# Patient Record
Sex: Male | Born: 1965 | Race: Black or African American | Hispanic: No | Marital: Single | Smoking: Current every day smoker
Health system: Southern US, Community
[De-identification: ages and names within clinical notes are randomized; demographics above are authoritative.]

## PROBLEM LIST (undated history)

## (undated) DIAGNOSIS — I10 Essential (primary) hypertension: Secondary | ICD-10-CM

## (undated) DIAGNOSIS — E119 Type 2 diabetes mellitus without complications: Secondary | ICD-10-CM

---

## 2013-06-16 ENCOUNTER — Emergency Department: Payer: Self-pay | Admitting: Emergency Medicine

## 2013-06-16 LAB — BASIC METABOLIC PANEL
Anion Gap: 8 (ref 7–16)
BUN: 23 mg/dL — ABNORMAL HIGH (ref 7–18)
CALCIUM: 8.7 mg/dL (ref 8.5–10.1)
Chloride: 92 mmol/L — ABNORMAL LOW (ref 98–107)
Co2: 28 mmol/L (ref 21–32)
Creatinine: 1.51 mg/dL — ABNORMAL HIGH (ref 0.60–1.30)
EGFR (African American): 48 — ABNORMAL LOW
GFR CALC NON AF AMER: 41 — AB
Glucose: 416 mg/dL — ABNORMAL HIGH (ref 65–99)
Osmolality: 278 (ref 275–301)
POTASSIUM: 4.4 mmol/L (ref 3.5–5.1)
Sodium: 128 mmol/L — ABNORMAL LOW (ref 136–145)

## 2013-06-16 LAB — COMPREHENSIVE METABOLIC PANEL
ALBUMIN: 3.9 g/dL (ref 3.4–5.0)
ALT: 77 U/L (ref 12–78)
Alkaline Phosphatase: 80 U/L
Anion Gap: 5 — ABNORMAL LOW (ref 7–16)
BILIRUBIN TOTAL: 0.6 mg/dL (ref 0.2–1.0)
BUN: 22 mg/dL — ABNORMAL HIGH (ref 7–18)
CALCIUM: 9.3 mg/dL (ref 8.5–10.1)
CO2: 27 mmol/L (ref 21–32)
CREATININE: 1.56 mg/dL — AB (ref 0.60–1.30)
Chloride: 89 mmol/L — ABNORMAL LOW (ref 98–107)
EGFR (Non-African Amer.): 40 — ABNORMAL LOW
GFR CALC AF AMER: 46 — AB
Glucose: 486 mg/dL — ABNORMAL HIGH (ref 65–99)
Osmolality: 269 (ref 275–301)
POTASSIUM: 5.6 mmol/L — AB (ref 3.5–5.1)
SGOT(AST): 57 U/L — ABNORMAL HIGH (ref 15–37)
SODIUM: 121 mmol/L — AB (ref 136–145)
TOTAL PROTEIN: 7.9 g/dL (ref 6.4–8.2)

## 2013-06-16 LAB — URINALYSIS, COMPLETE
BACTERIA: NONE SEEN
BILIRUBIN, UR: NEGATIVE
Blood: NEGATIVE
Ketone: NEGATIVE
Leukocyte Esterase: NEGATIVE
NITRITE: NEGATIVE
Ph: 5 (ref 4.5–8.0)
Protein: NEGATIVE
Specific Gravity: 1.025 (ref 1.003–1.030)

## 2013-06-16 LAB — CBC WITH DIFFERENTIAL/PLATELET
BASOS PCT: 1 %
Basophil #: 0.1 10*3/uL (ref 0.0–0.1)
EOS ABS: 0.3 10*3/uL (ref 0.0–0.7)
EOS PCT: 3.4 %
HCT: 40.4 % (ref 40.0–52.0)
HGB: 13.3 g/dL (ref 13.0–18.0)
Lymphocyte #: 2.4 10*3/uL (ref 1.0–3.6)
Lymphocyte %: 32.1 %
MCH: 26 pg (ref 26.0–34.0)
MCHC: 32.9 g/dL (ref 32.0–36.0)
MCV: 79 fL — ABNORMAL LOW (ref 80–100)
MONOS PCT: 5.9 %
Monocyte #: 0.4 x10 3/mm (ref 0.2–1.0)
NEUTROS ABS: 4.3 10*3/uL (ref 1.4–6.5)
Neutrophil %: 57.6 %
PLATELETS: 595 10*3/uL — AB (ref 150–440)
RBC: 5.12 10*6/uL (ref 4.40–5.90)
RDW: 22.9 % — AB (ref 11.5–14.5)
WBC: 7.5 10*3/uL (ref 3.8–10.6)

## 2013-06-16 LAB — CBC
HCT: 43.6 % (ref 40.0–52.0)
HGB: 14.6 g/dL (ref 13.0–18.0)
MCH: 26.4 pg (ref 26.0–34.0)
MCHC: 33.6 g/dL (ref 32.0–36.0)
MCV: 79 fL — ABNORMAL LOW (ref 80–100)
Platelet: 635 10*3/uL — ABNORMAL HIGH (ref 150–440)
RBC: 5.55 10*6/uL (ref 4.40–5.90)
RDW: 22.9 % — AB (ref 11.5–14.5)
WBC: 9.9 10*3/uL (ref 3.8–10.6)

## 2013-06-16 LAB — TROPONIN I: Troponin-I: <0.02 ng/mL

## 2013-06-16 LAB — LIPASE, BLOOD: LIPASE: 157 U/L (ref 73–393)

## 2013-06-17 ENCOUNTER — Emergency Department: Payer: Self-pay | Admitting: Emergency Medicine

## 2013-06-17 LAB — CBC
HCT: 38.8 % — AB (ref 40.0–52.0)
HGB: 12.9 g/dL — AB (ref 13.0–18.0)
MCH: 26.3 pg (ref 26.0–34.0)
MCHC: 33.3 g/dL (ref 32.0–36.0)
MCV: 79 fL — AB (ref 80–100)
Platelet: 573 10*3/uL — ABNORMAL HIGH (ref 150–440)
RBC: 4.92 10*6/uL (ref 4.40–5.90)
RDW: 22.9 % — ABNORMAL HIGH (ref 11.5–14.5)
WBC: 9.8 10*3/uL (ref 3.8–10.6)

## 2013-06-17 LAB — BASIC METABOLIC PANEL
Anion Gap: 8 (ref 7–16)
BUN: 36 mg/dL — ABNORMAL HIGH (ref 7–18)
CREATININE: 2.08 mg/dL — AB (ref 0.60–1.30)
Calcium, Total: 8.4 mg/dL — ABNORMAL LOW (ref 8.5–10.1)
Chloride: 89 mmol/L — ABNORMAL LOW (ref 98–107)
Co2: 28 mmol/L (ref 21–32)
EGFR (Non-African Amer.): 37 — ABNORMAL LOW
GFR CALC AF AMER: 43 — AB
GLUCOSE: 281 mg/dL — AB (ref 65–99)
Osmolality: 270 (ref 275–301)
Potassium: 3.9 mmol/L (ref 3.5–5.1)
Sodium: 125 mmol/L — ABNORMAL LOW (ref 136–145)

## 2014-09-01 ENCOUNTER — Emergency Department
Admission: EM | Admit: 2014-09-01 | Discharge: 2014-09-02 | Disposition: A | Discharge status: Home / Self Care | Payer: BLUE CROSS/BLUE SHIELD | Attending: Emergency Medicine | Admitting: Emergency Medicine

## 2014-09-01 ENCOUNTER — Encounter: Payer: Self-pay | Admitting: Emergency Medicine

## 2014-09-01 DIAGNOSIS — R109 Unspecified abdominal pain: Secondary | ICD-10-CM | POA: Diagnosis present

## 2014-09-01 DIAGNOSIS — I10 Essential (primary) hypertension: Secondary | ICD-10-CM | POA: Insufficient documentation

## 2014-09-01 DIAGNOSIS — E119 Type 2 diabetes mellitus without complications: Secondary | ICD-10-CM | POA: Insufficient documentation

## 2014-09-01 DIAGNOSIS — Z720 Tobacco use: Secondary | ICD-10-CM | POA: Insufficient documentation

## 2014-09-01 DIAGNOSIS — R1010 Upper abdominal pain, unspecified: Secondary | ICD-10-CM

## 2014-09-01 HISTORY — DX: Type 2 diabetes mellitus without complications: E11.9

## 2014-09-01 HISTORY — DX: Essential (primary) hypertension: I10

## 2014-09-01 LAB — COMPREHENSIVE METABOLIC PANEL
ALBUMIN: 3.7 g/dL (ref 3.5–5.0)
ALT: 46 U/L (ref 17–63)
AST: 28 U/L (ref 15–41)
Alkaline Phosphatase: 60 U/L (ref 38–126)
Anion gap: 8 (ref 5–15)
BILIRUBIN TOTAL: 0.5 mg/dL (ref 0.3–1.2)
BUN: 13 mg/dL (ref 6–20)
CHLORIDE: 107 mmol/L (ref 101–111)
CO2: 25 mmol/L (ref 22–32)
CREATININE: 1.01 mg/dL (ref 0.61–1.24)
Calcium: 8.5 mg/dL — ABNORMAL LOW (ref 8.9–10.3)
GFR calc Af Amer: >60 mL/min (ref >60)
Glucose, Bld: 158 mg/dL — ABNORMAL HIGH (ref 65–99)
POTASSIUM: 4.1 mmol/L (ref 3.5–5.1)
Sodium: 140 mmol/L (ref 135–145)
TOTAL PROTEIN: 6.7 g/dL (ref 6.5–8.1)

## 2014-09-01 LAB — URINALYSIS COMPLETE WITH MICROSCOPIC (ARMC ONLY)
BACTERIA UA: NONE SEEN
BILIRUBIN URINE: NEGATIVE
Glucose, UA: NEGATIVE mg/dL
Hgb urine dipstick: NEGATIVE
Ketones, ur: NEGATIVE mg/dL
Leukocytes, UA: NEGATIVE
Nitrite: NEGATIVE
PH: 5 (ref 5.0–8.0)
Protein, ur: NEGATIVE mg/dL
SPECIFIC GRAVITY, URINE: 1.024 (ref 1.005–1.030)
SQUAMOUS EPITHELIAL / LPF: NONE SEEN

## 2014-09-01 LAB — LIPASE, BLOOD: Lipase: 46 U/L (ref 22–51)

## 2014-09-01 LAB — TROPONIN I: Troponin I: <0.03 ng/mL (ref <0.031)

## 2014-09-01 NOTE — ED Notes (Signed)
Patient ambulatory to triage with steady gait, without difficulty or distress noted; pt reports left sided abd pain x week with no accomp symptoms

## 2014-09-02 ENCOUNTER — Emergency Department: Payer: BLUE CROSS/BLUE SHIELD

## 2014-09-02 LAB — CBC WITH DIFFERENTIAL/PLATELET
BAND NEUTROPHILS: 0 % (ref 0–10)
Basophils Absolute: 0 10*3/uL (ref 0.0–0.1)
Basophils Relative: 0 % (ref 0–1)
Blasts: 0 %
EOS PCT: 1 % (ref 0–5)
Eosinophils Absolute: 0.1 10*3/uL (ref 0.0–0.7)
HCT: 42.6 % (ref 40.0–52.0)
HEMOGLOBIN: 13.8 g/dL (ref 13.0–18.0)
LYMPHS ABS: 3.4 10*3/uL (ref 0.7–4.0)
Lymphocytes Relative: 30 % (ref 12–46)
MCH: 27.6 pg (ref 26.0–34.0)
MCHC: 32.5 g/dL (ref 32.0–36.0)
MCV: 84.8 fL (ref 80.0–100.0)
MYELOCYTES: 2 %
Metamyelocytes Relative: 2 %
Monocytes Absolute: 0.6 10*3/uL (ref 0.1–1.0)
Monocytes Relative: 5 % (ref 3–12)
NEUTROS ABS: 7.3 10*3/uL (ref 1.7–7.7)
NEUTROS PCT: 60 % (ref 43–77)
Other: 0 %
PROMYELOCYTES ABS: 0 %
Platelets: 348 10*3/uL (ref 150–440)
RBC: 5.02 MIL/uL (ref 4.40–5.90)
RDW: 22.1 % — ABNORMAL HIGH (ref 11.5–14.5)
WBC: 11.4 10*3/uL — ABNORMAL HIGH (ref 3.8–10.6)
nRBC: 4 /100 WBC — ABNORMAL HIGH

## 2014-09-02 LAB — PATHOLOGIST SMEAR REVIEW

## 2014-09-02 MED ORDER — ONDANSETRON HCL 4 MG/2ML IJ SOLN
INTRAMUSCULAR | Status: AC
  Administered 2014-09-02: 4 mg via INTRAVENOUS
  Filled 2014-09-02: qty 2

## 2014-09-02 MED ORDER — PANTOPRAZOLE SODIUM 40 MG PO TBEC: 40.0000 mg | DELAYED_RELEASE_TABLET | Freq: Every day | ORAL | Status: AC

## 2014-09-02 MED ORDER — MORPHINE SULFATE 2 MG/ML IJ SOLN
INTRAMUSCULAR | Status: AC
  Administered 2014-09-02: 2 mg via INTRAVENOUS
  Filled 2014-09-02: qty 1

## 2014-09-02 MED ORDER — MORPHINE SULFATE 2 MG/ML IJ SOLN
2.0000 mg | Freq: Once | INTRAMUSCULAR | Status: AC
  Administered 2014-09-02: 2 mg via INTRAVENOUS

## 2014-09-02 MED ORDER — IOHEXOL 300 MG/ML  SOLN
100.0000 mL | Freq: Once | INTRAMUSCULAR | Status: AC | PRN
  Administered 2014-09-02: 100 mL via INTRAVENOUS

## 2014-09-02 MED ORDER — ONDANSETRON HCL 4 MG/2ML IJ SOLN
4.0000 mg | Freq: Once | INTRAMUSCULAR | Status: AC
  Administered 2014-09-02: 4 mg via INTRAVENOUS

## 2014-09-02 MED ORDER — IOHEXOL 240 MG/ML SOLN
25.0000 mL | Freq: Once | INTRAMUSCULAR | Status: AC | PRN
Start: 2014-09-02 — End: 2014-09-02
  Administered 2014-09-02: 25 mL via ORAL

## 2014-09-02 NOTE — ED Notes (Signed)
Patient with no complaints at this time. Respirations even and unlabored. Skin warm/dry. Discharge instructions reviewed with patient at this time. Patient given opportunity to voice concerns/ask questions. IV removed per policy and band-aid applied to site. Patient discharged at this time and left Emergency Department with steady gait.  

## 2014-09-02 NOTE — ED Provider Notes (Signed)
[facility] Emergency Department Provider Note  ____________________________________________  Time seen: 11:55 PM  I have reviewed the triage vital signs and the nursing notes.   HISTORY  Chief Complaint Abdominal Pain      HPI Troy Sparks is a 49 y.o. male presents with 8 out of 10 epigastric/left upper quadrant pain times one week. Patient denies any fever no nausea no vomiting no constipation or diarrhea.     Past Medical History  Diagnosis Date  . Hypertension   . Diabetes     There are no active problems to display for this patient.   History reviewed. No pertinent past surgical history.  No current outpatient prescriptions on file.  Allergies Review of patient's allergies indicates no known allergies.  No family history on file.  Social History History  Substance Use Topics  . Smoking status: Current Every Day Smoker  . Smokeless tobacco: Not on file  . Alcohol Use: Yes    Review of Systems  Constitutional: Negative for fever. Eyes: Negative for visual changes. ENT: Negative for sore throat. Cardiovascular: Negative for chest pain. Respiratory: Negative for shortness of breath. Gastrointestinal: Positive for abdominal pain Genitourinary: Negative for dysuria. Musculoskeletal: Negative for back pain. Skin: Negative for rash. Neurological: Negative for headaches, focal weakness or numbness.   10-point ROS otherwise negative.  ____________________________________________   PHYSICAL EXAM:  VITAL SIGNS: ED Triage Vitals  Enc Vitals Group     BP 09/01/14 2142 136/86 mmHg     Pulse Rate 09/01/14 2142 63     Resp 09/02/14 0006 13     Temp 09/01/14 2142 98 F (36.7 C)     Temp Source 09/01/14 2142 Oral     SpO2 09/01/14 2142 99 %     Weight 09/01/14 2142 290 lb (131.543 kg)     Height 09/01/14 2142 5\' 8"  (1.727 m)     Head Cir --      Peak Flow --      Pain Score 09/01/14 2142 6     Pain Loc --      Pain Edu?  --      Excl. in GC? --      Constitutional: Alert and oriented. Well appearing and in no distress. Eyes: Conjunctivae are normal. PERRL. Normal extraocular movements. ENT   Head: Normocephalic and atraumatic.   Nose: No congestion/rhinnorhea.   Mouth/Throat: Mucous membranes are moist.   Neck: No stridor. Cardiovascular: Normal rate, regular rhythm. Normal and symmetric distal pulses are present in all extremities. No murmurs, rubs, or gallops. Respiratory: Normal respiratory effort without tachypnea nor retractions. Breath sounds are clear and equal bilaterally. No wheezes/rales/rhonchi. Gastrointestinal: Soft and nontender. No distention. There is no CVA tenderness. Genitourinary: deferred Musculoskeletal: Nontender with normal range of motion in all extremities. No joint effusions.  No lower extremity tenderness nor edema. Neurologic:  Normal speech and language. No gross focal neurologic deficits are appreciated. Speech is normal.  Skin:  Skin is warm, dry and intact. No rash noted. Psychiatric: Mood and affect are normal. Speech and behavior are normal. Patient exhibits appropriate insight and judgment.  ____________________________________________    LABS (pertinent positives/negatives)  Labs Reviewed  CBC WITH DIFFERENTIAL/PLATELET - Abnormal; Notable for the following:    WBC 11.4 (*)    RDW 22.1 (*)    nRBC 4 (*)    All other components within normal limits  COMPREHENSIVE METABOLIC PANEL - Abnormal; Notable for the following:    Glucose, Bld 158 (*)  Calcium 8.5 (*)    All other components within normal limits  URINALYSIS COMPLETEWITH MICROSCOPIC ([facility] ONLY) - Abnormal; Notable for the following:    Color, Urine YELLOW (*)    APPearance CLEAR (*)    All other components within normal limits  LIPASE, BLOOD  TROPONIN I     ____________________________________________    RADIOLOGY  CT abdomen and pelvis revealed "liver  steatosis.  ____________________________________________     INITIAL IMPRESSION / ASSESSMENT AND PLAN / ED COURSE  Pertinent labs & imaging results that were available during my care of the patient were reviewed by me and considered in my medical decision making (see chart for details).  Given history and physical exam will perform CT scan of the abdomen and pelvis. ----------------------------------------- 3:42 AM on 09/02/2014 -----------------------------------------  No acute abnormality noted on the CT scan of the abdomen and pelvis patient will be referred to gastroenterology for outpatient follow-up  ____________________________________________   FINAL CLINICAL IMPRESSION(S) / ED DIAGNOSES  Final diagnoses:  Pain of upper abdomen      Darci Current, MD 09/02/14 [id number]

## 2014-09-02 NOTE — Discharge Instructions (Signed)

## 2014-09-03 ENCOUNTER — Emergency Department: Payer: BLUE CROSS/BLUE SHIELD

## 2016-01-28 ENCOUNTER — Inpatient Hospital Stay
Admit: 2016-01-28 | Discharge: 2016-01-28 | Discharge status: Against Medical Advice | Payer: BLUE CROSS/BLUE SHIELD | Source: Ambulatory Visit | Attending: Family Medicine | Admitting: Family Medicine

## 2016-01-28 DIAGNOSIS — E871 Hypo-osmolality and hyponatremia: Secondary | ICD-10-CM

## 2016-01-28 NOTE — Progress Notes (Signed)
Pt arrived by ambulance ay 0225, pt refused to be admitted did not get undressed or get into bed sat in chair refused to see dr. Leonides Schanz ama papers 20 gadge in lac removed pt went to lobby at that time, clinical house there with pt til cab came at 0300. Pt then left via cab at that time.

## 2016-01-28 NOTE — H&P (Signed)
Patient did not want to stay. Left AMA. I spoke with him on his way out and was not able to convince him otherwise.     The patient was alert and oriented to time, person and place. I told him to limit water intake and manifested that leaving constitutes and immediate risk to his health and life. Hyponatremia is a life threatening condition and he could quickly decompensate and develop complications, including but not limited to: confusion, seizures and death. He understood this risk and decided to leave anyway.    Winter Jocelyn A. Damone Fancher, M.D.  Hospitalist service

## 2016-01-28 NOTE — H&P (Signed)
Patient did not want to stay. Left AMA. I spoke with him on his way out and was not able to convince him otherwise.     The patient was alert and oriented to time, person and place. I told him to limit water intake and manifested that leaving constitutes and immediate risk to his health and life. Hyponatremia is a life threatening condition and he could quickly decompensate and develop complications, including but not limited to: confusion, seizures and death. He understood this risk and decided to leave anyway.    Melyssa Signor A. Van ClinesPolidori, M.D.  Hospitalist service

## 2016-09-12 IMAGING — CT CT ABD-PELV W/ CM
2 of 5 series · 17 of 46 positions shown, 19 images · IV contrast (omnipaque)
Comparison: None.

CLINICAL DATA: Lower abdominal pain.

EXAM:
CT ABDOMEN AND PELVIS WITH CONTRAST
TECHNIQUE: Multidetector CT imaging of the abdomen and pelvis was performed
using the standard protocol following bolus administration of
intravenous contrast.
CONTRAST:  100mL OMNIPAQUE IOHEXOL 300 MG/ML  SOLN

[Series 2: routine abd pel with · axial · 0.88mm/px · z∈[-893,-493]mm · 14 of 92 slices shown, 16 images]
[im 6/92  soft-tissue]
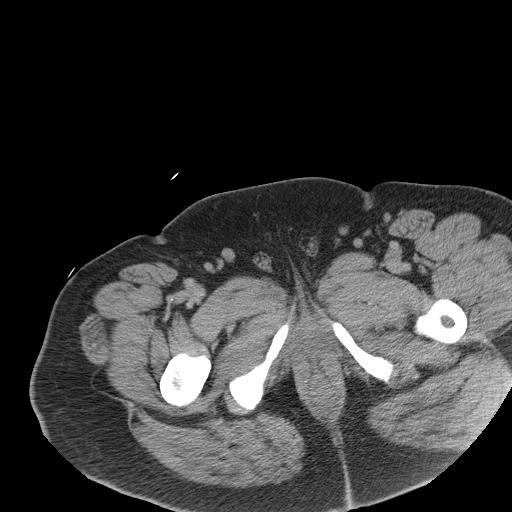
[im 6/92  bone]
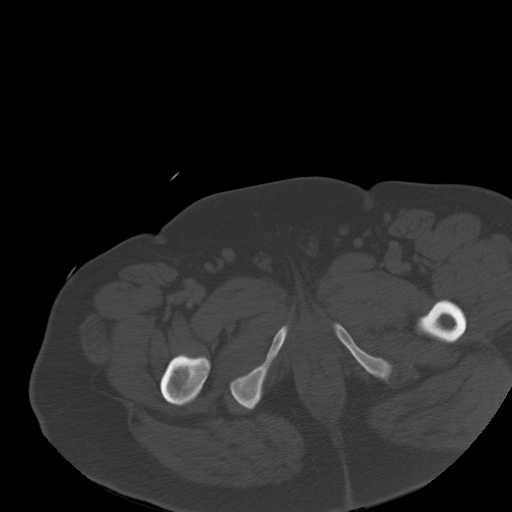
[im 11/92  soft-tissue]
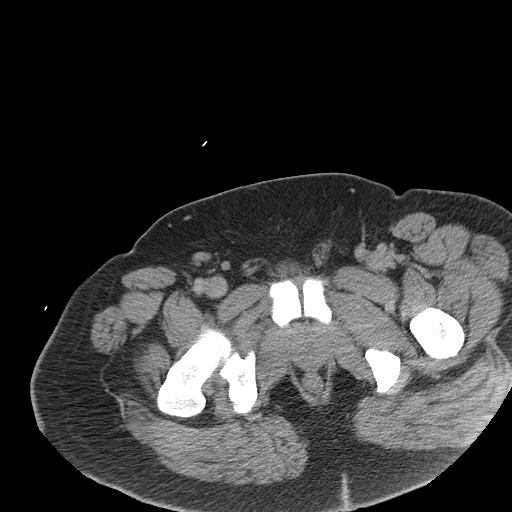
[im 21/92  soft-tissue]
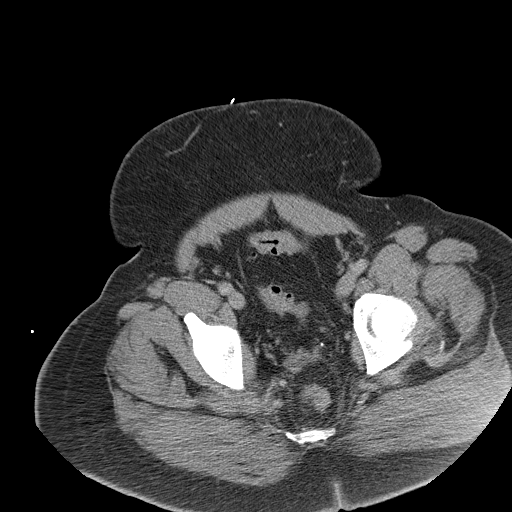
[im 26/92  soft-tissue]
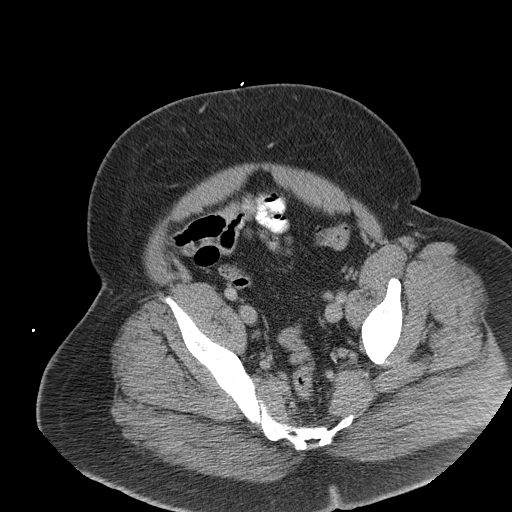
[im 31/92  soft-tissue]
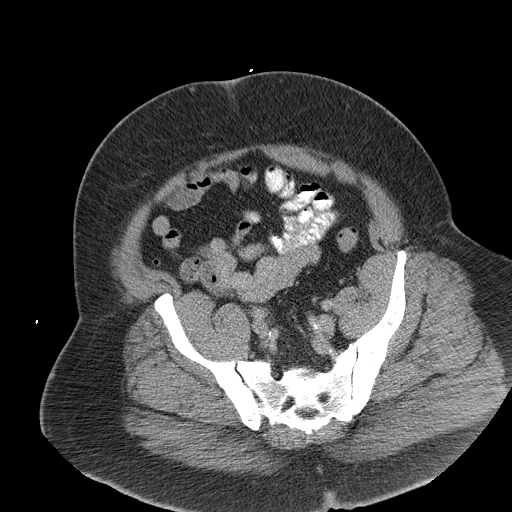
[im 36/92  soft-tissue]
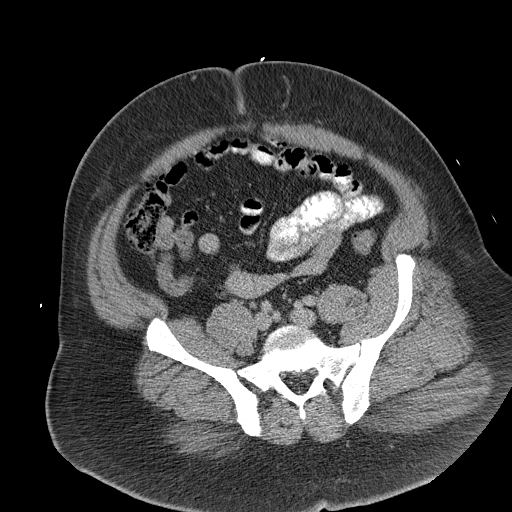
[im 41/92  soft-tissue]
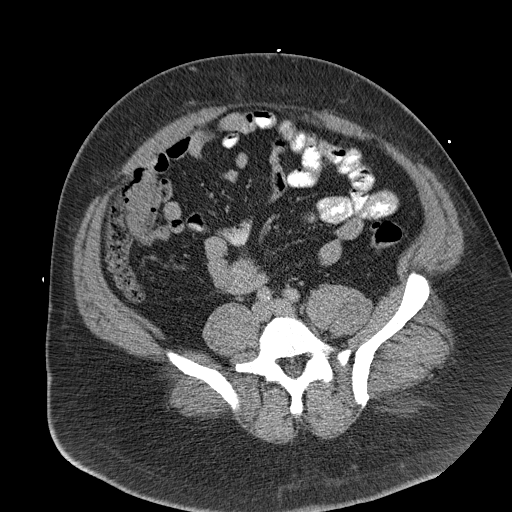
[im 51/92  soft-tissue]
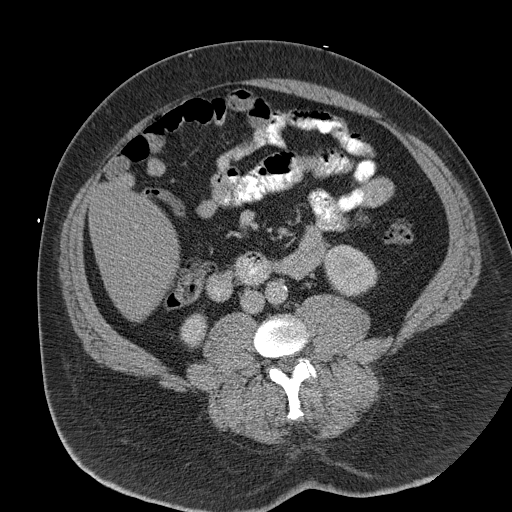
[im 56/92  soft-tissue]
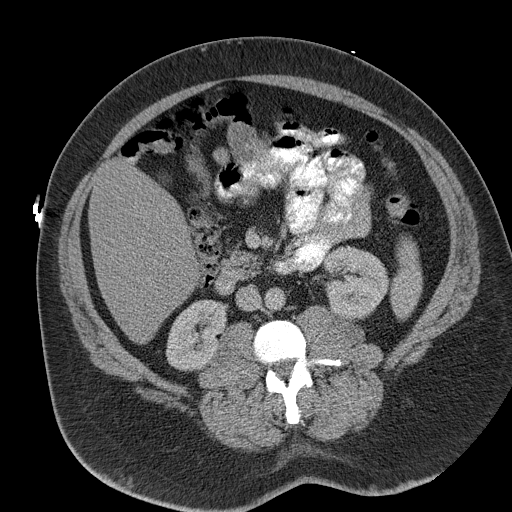
[im 56/92  bone]
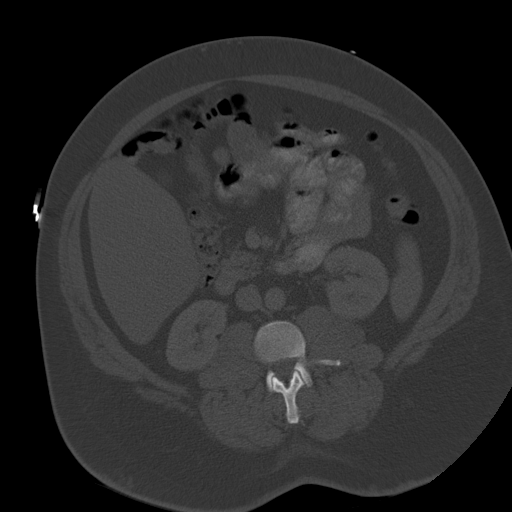
[im 61/92  soft-tissue]
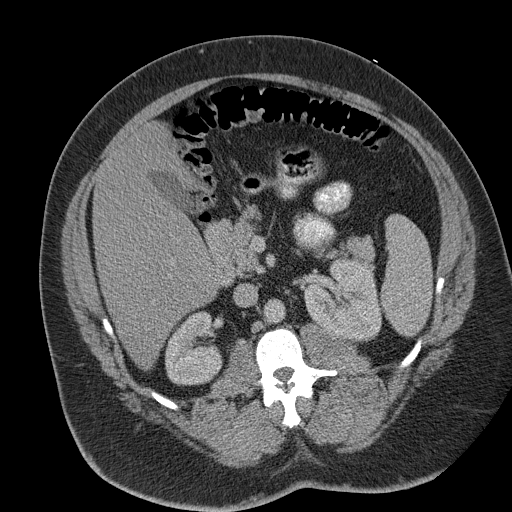
[im 66/92  soft-tissue]
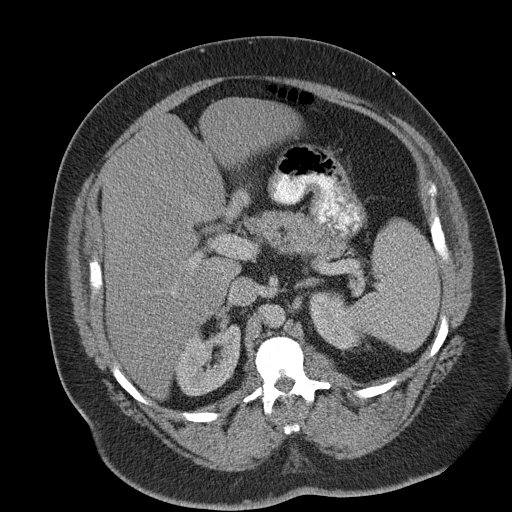
[im 71/92  soft-tissue]
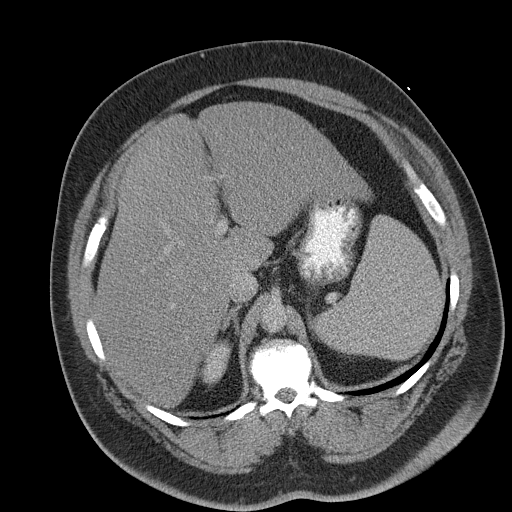
[im 81/92  soft-tissue]
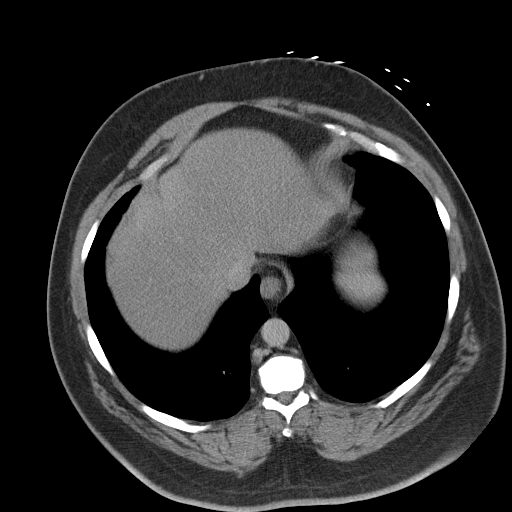
[im 86/92  soft-tissue]
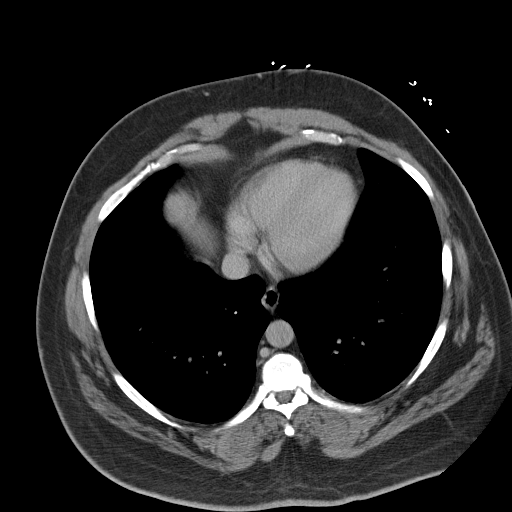

[Series 6: cor routine abd pel with · coronal · 0.97mm/px · 3 of 186 slices shown]
[im 62/186  soft-tissue]
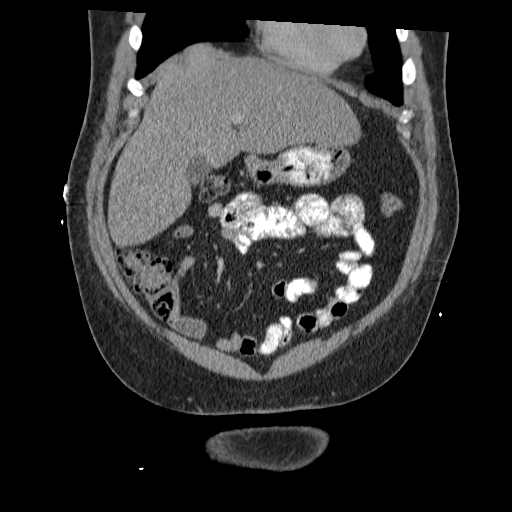
[im 83/186  soft-tissue]
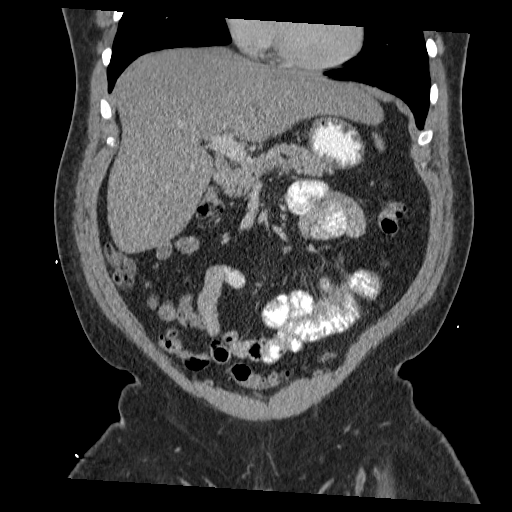
[im 103/186  soft-tissue]
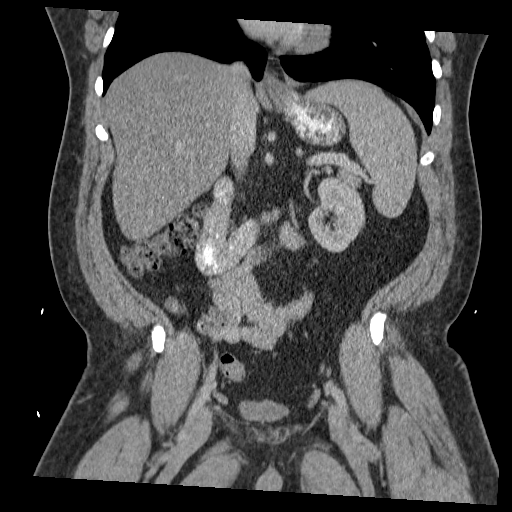

[17 of 46 positions shown; findings below may reference images not displayed]

FINDINGS: There is fatty infiltration of the liver. There is no focal liver
lesion. There is no bile duct dilatation.

There are unremarkable appearances of the spleen, pancreas, adrenals
and kidneys.

The abdominal aorta is normal in caliber with mild atherosclerotic
calcifications.

Bowel is unremarkable.

There is no acute inflammatory change in the abdomen or pelvis.

There is no ascites.

There is no abnormality in the lower chest.

There is mild chronic anterior wedging of T12 and T11. No
significant musculoskeletal lesion is evident.
IMPRESSION: Fatty liver.
